# Patient Record
Sex: Male | Born: 1995 | Race: Black or African American | Hispanic: No | Marital: Single | Smoking: Never smoker
Health system: Southern US, Community
[De-identification: ages and names within clinical notes are randomized; demographics above are authoritative.]

## PROBLEM LIST (undated history)

## (undated) DIAGNOSIS — F988 Other specified behavioral and emotional disorders with onset usually occurring in childhood and adolescence: Secondary | ICD-10-CM

## (undated) DIAGNOSIS — R569 Unspecified convulsions: Secondary | ICD-10-CM

## (undated) DIAGNOSIS — G40309 Generalized idiopathic epilepsy and epileptic syndromes, not intractable, without status epilepticus: Secondary | ICD-10-CM

## (undated) HISTORY — DX: Unspecified convulsions: R56.9

## (undated) HISTORY — DX: Generalized idiopathic epilepsy and epileptic syndromes, not intractable, without status epilepticus: G40.309

## (undated) HISTORY — DX: Other specified behavioral and emotional disorders with onset usually occurring in childhood and adolescence: F98.8

---
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
## 2016-01-20 ENCOUNTER — Ambulatory Visit: Payer: Self-pay | Admitting: Pediatrics

## 2016-03-12 ENCOUNTER — Telehealth: Payer: Self-pay

## 2016-03-12 DIAGNOSIS — G40309 Generalized idiopathic epilepsy and epileptic syndromes, not intractable, without status epilepticus: Secondary | ICD-10-CM

## 2016-03-12 MED ORDER — LEVETIRACETAM 500 MG PO TABS: ORAL_TABLET | ORAL | 2 refills | Status: DC

## 2016-03-12 NOTE — Telephone Encounter (Signed)
Dana, mom, lvm stating that child is away at college. She is aware that he needs a f/u appt, however, he will not be home until Thanksgiving and winter break. She is requesting a refill be sent to the pharmacy for his levetiracetam 500 mg tab. CB# [phone]

## 2016-03-12 NOTE — Telephone Encounter (Signed)
Steven Norris was last seen June 21, 2014 and has cancelled 4 appointments since then. I sent in refills to last until mid-December, wrote a letter to the patient about the need for a visit and sent a message to the scheduler to schedule a follow up appointment for him when he is at home from college. TG

## 2016-03-12 NOTE — Telephone Encounter (Signed)
Noted, thank you

## 2016-03-13 ENCOUNTER — Telehealth: Payer: Self-pay

## 2016-03-13 NOTE — Telephone Encounter (Signed)
[facility] to [facility]

## 2016-03-13 NOTE — Telephone Encounter (Signed)
-----   Message from Elveria Risingina Goodpasture, NP sent at 03/12/2016 11:38 AM EDT ----- Regarding: Needs appointment Clayburn PertEvan needs an appointment with Dr Sharene SkeansHickling. It can be when he is at home from college for Thanksgiving or Christmas but he must be seen by July 01, 2016 in order for us to continue to refill his medication.  Let me know if you have questions.  Thanks, Inetta Fermoina

## 2016-06-18 ENCOUNTER — Ambulatory Visit (INDEPENDENT_AMBULATORY_CARE_PROVIDER_SITE_OTHER): Payer: Self-pay | Admitting: Pediatrics

## 2016-10-16 ENCOUNTER — Telehealth (INDEPENDENT_AMBULATORY_CARE_PROVIDER_SITE_OTHER): Payer: Self-pay | Admitting: Pediatrics

## 2016-10-16 DIAGNOSIS — G40309 Generalized idiopathic epilepsy and epileptic syndromes, not intractable, without status epilepticus: Secondary | ICD-10-CM

## 2016-10-16 MED ORDER — LEVETIRACETAM 500 MG PO TABS: ORAL_TABLET | ORAL | 0 refills | Status: DC

## 2016-10-16 NOTE — Telephone Encounter (Signed)
I called the patient and insisted that he keep his appointment on Nov 23, 2016.  We cannot continue to keep refilling prescriptions.  His last visit was in 2015.  He is away at college.

## 2016-10-16 NOTE — Telephone Encounter (Signed)
°  Who's calling (name and relationship to patient) : Phoebe Sharps Best contact number: [phone] Provider they see: Sharene Skeans, MD Reason for call: Needing refill    PRESCRIPTION REFILL ONLY  Name of prescription: 500 mg keppra Pharmacy: [facility], [place]

## 2016-11-23 ENCOUNTER — Encounter (INDEPENDENT_AMBULATORY_CARE_PROVIDER_SITE_OTHER): Payer: Self-pay | Admitting: Pediatrics

## 2016-11-23 ENCOUNTER — Ambulatory Visit (INDEPENDENT_AMBULATORY_CARE_PROVIDER_SITE_OTHER): Payer: Self-pay | Admitting: Pediatrics

## 2016-11-23 VITALS — BP 110/70 | HR 76 | Ht 73.75 in | Wt 199.8 lb

## 2016-11-23 DIAGNOSIS — G40309 Generalized idiopathic epilepsy and epileptic syndromes, not intractable, without status epilepticus: Secondary | ICD-10-CM

## 2016-11-23 MED ORDER — LEVETIRACETAM 500 MG PO TABS: ORAL_TABLET | ORAL | 5 refills | Status: DC

## 2016-11-23 NOTE — Progress Notes (Signed)
Patient: Steven Norris MRN: [id number] Sex: male DOB: 1995/08/12  Provider: Ellison Carwin, MD Location of Care: [facility]  Note type: Routine return visit  History of Present Illness: Referral Source: Dr. Duncan Dull History from: both parents, patient and [facility] chart Chief Complaint: Epilepsy/ADD  Steven Norris is a 21 y.o. male who returns on Nov 23, 2016 for the first time since June 21, 2014.  [name] has a history of generalized tonic-clonic seizures, the last of which was July 13, 2013.  When he was younger, he had non-convulsive seizures.  He had onset of seizures as a toddler and was on Neurontin.  When he developed generalized tonic-clonic seizures on December 14, 2001, he was switched from Neurontin to Depakote.  He was switched to levetiracetam when he experienced increasing frequency of nonconvulsive seizures.    Fortunately, levetiracetam has controlled all of his events.  He presents today for routine visit.  He has completed his first year at [employer] and hopes after next year to be able to transfer to [employer] to study business law.  His health is good.  He goes to bed between 11 and 12 and gets up at 9.  He works in Airline pilot.  Neither Devin nor his parents voiced any concerns today.  Review of Systems: 12 system review was assessed and was negative  Past Medical History Diagnosis Date  . Attention deficit disorder without mention of hyperactivity   . Generalized convulsive epilepsy without mention of intractable epilepsy   . Generalized nonconvulsive epilepsy without mention of intractable epilepsy   . Seizures (HCC)    Hospitalizations: No., Head Injury: No., Nervous System Infections: No., Immunizations up to date: Yes.    The patient had a generalized tonic-clonic seizure on December 14, 2001, and was switched from Neurontin to Depakote. Subsequently, he has experienced episodes of unresponsive staring that are quite  brief.  Switched from Depakote to Keppra which controlled his convulsive seizures without significant side effects.  He has attention deficit disorder, which is well controlled with generic Adderall.  ER visit due to depression in November of 2015.  Birth History 7 ounce infant born at [redacted] weeks gestational age to a 21 year old primigravida.  Gestation was unremarkable.  Mother received epidural anesthesia.  Normal spontaneous delivery.  Nursery course was uneventful. He went home with his mother.  Growth and development was recalled, but not recorded as normal.  Behavior History none  Surgical History Past Surgical History:  Procedure Laterality Date  . REPAIR PATELLAR / INFRAPATELLARTENDON Left 2010    Family History family history includes Heart attack (age of onset: 64) in his paternal grandfather. Family history is negative for migraines, seizures, intellectual disabilities, blindness, deafness, birth defects, chromosomal disorder, or autism.  Social History Social History   Social History  . Marital status: Single  . Years of education: 13 years   Social History Main Topics  . Smoking status: Never Smoker  . Smokeless tobacco: Never Used  . Alcohol use No  . Drug use: No  . 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     He attends [employer].    He lives with his mom. He has no siblings.    He works part-time.   No Known Allergies  Physical Exam BP 110/70   Pulse 76   Ht  6' 1.75" (1.873 m)   Wt 199 lb 12.8 oz (90.6 kg)   BMI 25.83 kg/m   General: alert, well developed, well nourished, in no acute distress, black hair, brown eyes, right handed Head: normocephalic, no dysmorphic features Ears, Nose and Throat: Otoscopic: tympanic membranes normal; pharynx: oropharynx is pink without exudates or tonsillar  hypertrophy Neck: supple, full range of motion, no cranial or cervical bruits Respiratory: auscultation clear Cardiovascular: no murmurs, pulses are normal Musculoskeletal: no skeletal deformities or apparent scoliosis Skin: no rashes or neurocutaneous lesions  Neurologic Exam  Mental Status: alert; oriented to person, place and year; knowledge is normal for age; language is normal Cranial Nerves: visual fields are full to double simultaneous stimuli; extraocular movements are full and conjugate; pupils are round reactive to light; funduscopic examination shows sharp disc margins with normal vessels; symmetric facial strength; midline tongue and uvula; air conduction is greater than bone conduction bilaterally Motor: Normal strength, tone and mass; good fine motor movements; no pronator drift Sensory: intact responses to cold, vibration, proprioception and stereognosis Coordination: good finger-to-nose, rapid repetitive alternating movements and finger apposition Gait and Station: normal gait and station: patient is able to walk on heels, toes and tandem without difficulty; balance is adequate; Romberg exam is negative; Gower response is negative Reflexes: symmetric and diminished bilaterally; no clonus; bilateral flexor plantar responses  Assessment 1.  Epilepsy generalized convulsive, G40.309.  Discussion [name] is doing well.  I explained that we would not consider taking him off medication until he is living on a campus where he does not need transportation.  I hope that we would successfully take him off medication.  If we are not, we want to make certain that it does not create a problem for him which would occur if he were to have breakthrough seizures now.  Plan I refilled his prescription for levetiracetam.  He will return to see me in a year.  I will see him sooner based on clinical need.  I spent 25 minutes of face-to-face time with [name] and his parents discussing strategies for  taking him off his medication and the benefits of timing it for when he hopefully goes to a four-year school.   Medication List   Accurate as of 11/23/16 12:05 PM.      levETIRAcetam 500 MG tablet Commonly known as:  KEPPRA Take 2 tablets by mouth twice a day    The medication list was reviewed and reconciled. All changes or newly prescribed medications were explained.  A complete medication list was provided to the patient/caregiver.  Deetta PerlaWilliam H Tylor Gambrill MD

## 2016-11-23 NOTE — Patient Instructions (Signed)
I refilled your prescription for 2 tablets twice daily.  I would like to see you in a year.  At that time if you [place] [facility], I will order an EEG and we will determine whether or not it's possible to taper and slowly take you off your medicine once you arrive on-campus.  Please sign up for My Chart to facilitate communication with my office.

## 2017-12-09 ENCOUNTER — Other Ambulatory Visit (INDEPENDENT_AMBULATORY_CARE_PROVIDER_SITE_OTHER): Payer: Self-pay | Admitting: Pediatrics

## 2017-12-09 DIAGNOSIS — G40309 Generalized idiopathic epilepsy and epileptic syndromes, not intractable, without status epilepticus: Secondary | ICD-10-CM

## 2018-01-23 ENCOUNTER — Other Ambulatory Visit (INDEPENDENT_AMBULATORY_CARE_PROVIDER_SITE_OTHER): Payer: Self-pay | Admitting: Pediatrics

## 2018-01-23 DIAGNOSIS — G40309 Generalized idiopathic epilepsy and epileptic syndromes, not intractable, without status epilepticus: Secondary | ICD-10-CM

## 2018-03-19 ENCOUNTER — Other Ambulatory Visit (INDEPENDENT_AMBULATORY_CARE_PROVIDER_SITE_OTHER): Payer: Self-pay | Admitting: Pediatrics

## 2018-03-19 DIAGNOSIS — G40309 Generalized idiopathic epilepsy and epileptic syndromes, not intractable, without status epilepticus: Secondary | ICD-10-CM

## 2018-04-29 ENCOUNTER — Other Ambulatory Visit: Payer: Self-pay | Admitting: Family

## 2018-04-29 DIAGNOSIS — G40309 Generalized idiopathic epilepsy and epileptic syndromes, not intractable, without status epilepticus: Secondary | ICD-10-CM

## 2018-04-30 ENCOUNTER — Telehealth (INDEPENDENT_AMBULATORY_CARE_PROVIDER_SITE_OTHER): Payer: Self-pay | Admitting: Pediatrics

## 2018-04-30 DIAGNOSIS — G40309 Generalized idiopathic epilepsy and epileptic syndromes, not intractable, without status epilepticus: Secondary | ICD-10-CM

## 2018-04-30 MED ORDER — LEVETIRACETAM 500 MG PO TABS: 1000.0000 mg | ORAL_TABLET | Freq: Two times a day (BID) | ORAL | 2 refills | Status: DC

## 2018-04-30 NOTE — Telephone Encounter (Signed)
°  Who's calling (name and relationship to patient) :Steven Norris  Best contact number:[phone]  Provider they see:  Reason for call: patient is away at college, needs refill for Steven Norris, mom states that the pharmacy want refill prescription, she states he is doing fine, just getting low on prescription- pharmacy is [facility] in [place] [address]-[phone] She also states he hasnt had an appt since 2018 and the earliest he could come in for an appt would be December    PRESCRIPTION REFILL ONLY  Name of prescription:  Pharmacy:

## 2018-04-30 NOTE — Telephone Encounter (Signed)
I spoke with mom and asked her to make an appointment for December.  I refilled prescription for 2 refills.

## 2018-08-08 ENCOUNTER — Other Ambulatory Visit (INDEPENDENT_AMBULATORY_CARE_PROVIDER_SITE_OTHER): Payer: Self-pay | Admitting: Pediatrics

## 2018-08-08 DIAGNOSIS — G40309 Generalized idiopathic epilepsy and epileptic syndromes, not intractable, without status epilepticus: Secondary | ICD-10-CM

## 2018-08-15 ENCOUNTER — Telehealth (INDEPENDENT_AMBULATORY_CARE_PROVIDER_SITE_OTHER): Payer: Self-pay | Admitting: Pediatrics

## 2018-08-15 DIAGNOSIS — G40309 Generalized idiopathic epilepsy and epileptic syndromes, not intractable, without status epilepticus: Secondary | ICD-10-CM

## 2018-08-15 MED ORDER — LEVETIRACETAM 500 MG PO TABS: 1000.0000 mg | ORAL_TABLET | Freq: Two times a day (BID) | ORAL | 1 refills | Status: DC

## 2018-08-15 NOTE — Telephone Encounter (Signed)
Rx has been sent to the pharmacy

## 2018-08-15 NOTE — Telephone Encounter (Signed)
°  Who's calling (name and relationship to patient) : Annabelle Harman (Mother)  Best contact number: [phone] Provider they see: Dr. Sharene Skeans  Reason for call: Mom requesting refill on pt's generic Keppra until his next appt on 3/25.      PRESCRIPTION REFILL ONLY  Name of prescription: Generic Keppra 500 mg [facility] [address] [phone]

## 2018-09-24 ENCOUNTER — Ambulatory Visit (INDEPENDENT_AMBULATORY_CARE_PROVIDER_SITE_OTHER): Payer: Self-pay | Admitting: Pediatrics

## 2018-10-22 ENCOUNTER — Other Ambulatory Visit (INDEPENDENT_AMBULATORY_CARE_PROVIDER_SITE_OTHER): Payer: Self-pay | Admitting: Pediatrics

## 2018-10-22 DIAGNOSIS — G40309 Generalized idiopathic epilepsy and epileptic syndromes, not intractable, without status epilepticus: Secondary | ICD-10-CM

## 2018-11-07 ENCOUNTER — Other Ambulatory Visit: Payer: Self-pay

## 2018-11-07 ENCOUNTER — Ambulatory Visit (INDEPENDENT_AMBULATORY_CARE_PROVIDER_SITE_OTHER): Payer: BLUE CROSS/BLUE SHIELD | Admitting: Pediatrics

## 2018-11-07 ENCOUNTER — Encounter (INDEPENDENT_AMBULATORY_CARE_PROVIDER_SITE_OTHER): Payer: Self-pay | Admitting: Pediatrics

## 2018-11-07 DIAGNOSIS — G40309 Generalized idiopathic epilepsy and epileptic syndromes, not intractable, without status epilepticus: Secondary | ICD-10-CM

## 2018-11-07 MED ORDER — LEVETIRACETAM 500 MG PO TABS: ORAL_TABLET | ORAL | 5 refills | Status: DC

## 2018-11-07 NOTE — Progress Notes (Signed)
This is a Pediatric Specialist E-Visit follow up consult provided via WebEx Steven Norris consented to an E-Visit consult today.  Location of patient: Steven Norris is at home  Location of provider: Jack QuartoWilliam ,MD is at [facility] Patient was referred by Steven Shamsullo, Teresa L, MD   The following participants were involved in this E-Visit: Steven PertEvan and Steven Norris  Chief Complain/ Reason for E-Visit today: Generalized convulsive epilepsy Total time on call: 15 minutes Follow up: 1 year    Patient: Steven Norris MRN: [id number] Sex: male DOB: 1995/11/27  Provider: Ellison CarwinWilliam , MD [facility] of Care[facility]  Note type: Routine return visit  History of Present Illness: Referral Source: Steven Dulleresa Tullo, MD History from: [facility] and [facility] Chief Complaint: Generalized convulsive epilepsy  Steven Norris is a 23 y.o. male who returns on Nov 07, 2018 for the first time since Nov 23, 2016.  The patient has primary generalized epilepsy with generalized convulsive seizures.  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  He has been treated with Neurontin and then Depakote and more recently with levetiracetam.  He has been reading about CBD and wondered whether that would be effective for him.  I told him that the medicine, Epidiolex was not available to him because his Norris type did not fit criteria for prescription.  He takes and tolerates generic levetiracetam and has not had any seizures in years.  He will begin senior year late this summer at [employer] majoring in business.  He is going to go to law school either at [employer] or [employer].  I told him that if he can get into [employer] and had a full ride scholarship that is what he should do.  He has worked in the [employer], but currently is unemployed because of the Aet[employer]onavirus.  He needs to have his car to get to and from his home to school and  to work.  For that reason, I do not think that there should be any change in his medication.  He is spending his time reading and watching TV.  He goes to bed at 1 a.m. and gets up at 9 or 10 a.m.  Though this is not the sleep schedule that I would prefer, he is getting adequate sleep, taking his medicine, and is having no difficulties.  He sleeps well.  He exercises by running in his backyard.  In general his health is good.  He had no other concerns today.  Review of Systems: A complete review of systems was assessed and was negative.  Past Medical History Diagnosis Date  . Attention deficit disorder without mention of hyperactivity   . Generalized convulsive epilepsy without mention of intractable epilepsy   . Generalized nonconvulsive epilepsy without mention of intractable epilepsy   . Seizures (HCC)    Hospitalizations: No., Head Injury: No., Nervous System Infections: No., Immunizations up to date: Yes.    Copied from prior chart Steven Norris on December 14, 2001, and was switched from Neurontin to Depakote. Subsequently, he has experienced episodes of unresponsive staring that are quite brief.  Switched from Depakote to Keppra which controlled his convulsive seizures without significant side effects.  He has attention deficit disorder, which is well controlled with generic Adderall.  ER visit due to depression in November of 2015.  Birth History 7 ounce infant born at 540 weeks gestational age to  a 22 year old primigravida.  Gestation was unremarkable.  Mother received epidural anesthesia.  Normal spontaneous delivery.  Nursery course was uneventful. He went home with his mother.  Growth and development was recalled, but not recorded as normal.  Behavior History none  Surgical History Procedure Laterality Date  . REPAIR PATELLAR / INFRAPATELLARTENDON Left 2010   Family History family history includes Heart attack (age of onset: 3)  in his paternal grandfather. Family history is negative for migraines, seizures, intellectual disabilities, blindness, deafness, birth defects, chromosomal disorder, or autism.  Social History Socioeconomic History  . Marital status: Single  . Years of education:  16 years  . Highest education level:  High school graduate, College junior  Occupational History  .  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 Financial resource strain: Not on file  . Food insecurity:    Worry: Not on file    Inability: Not on file  . Transportation needs:    Medical: Not on file    Non-medical: Not on file  Tobacco Use  . Smoking status: Never Smoker  . Smokeless tobacco: Never Used  Substance and Sexual Activity  . Alcohol use: No    Alcohol/week: 0.0 standard drinks  . Drug use: No  . 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     He attends [employer]    He lives with his mom. He has no siblings.    He works part-time, currently on Ship broker.   No Known Allergies  Physical Exam There were no vitals taken for this visit.  General: alert, well developed, well nourished, in no acute distress, black hair, brown eyes, right handed Head: normocephalic, no dysmorphic features Neck: supple, full range of motion Musculoskeletal: no skeletal deformities or apparent scoliosis Skin: no neurocutaneous lesions, mild facial acne  Neurologic Exam  Mental Status: alert; oriented to person, place and year; knowledge is normal for age; language is normal Cranial Nerves: visual fields are full to double simultaneous stimuli; extraocular movements are full and conjugate; symmetric facial strength; midline tongue; hearing appears to be normal bilaterally Motor: normal functional strength, tone and mass; good fine motor movements; no pronator drift Coordination: good  finger-to-nose, rapid repetitive alternating movements and finger apposition Gait and Station: normal gait and station: patient is able to walk on heels, toes and tandem without difficulty; balance is adequate; Romberg exam is negative; Gower response is negative Reflexes: symmetric and diminished bilaterally; no clonus; bilateral flexor plantar responses  Assessment 1.  Generalized convulsive epilepsy, G40.309.  Discussion I am pleased that the patient is doing well.  We discussed the need to continue his levetiracetam without change and the reason why CBD oil or Epidiolex would not be appropriate for him.  Plan I refilled his prescription for levetiracetam.  He will return to see me in a year.  Greater than 50% of a 15 minute visit was spent in counseling and coordination of care.  His evaluation was carried out through WebEx.   Medication List   Accurate as of Nov 07, 2018  4:12 PM. If you have any questions, ask your nurse or doctor.    levETIRAcetam 500 MG tablet Commonly known as:  KEPPRA TAKE 2 TABLETS(1000 MG) BY MOUTH TWICE DAILY    The medication list was reviewed and reconciled. All changes or newly prescribed medications were explained.  A complete medication list was provided to the patient/caregiver.  Jodi Geralds MD

## 2018-11-07 NOTE — Patient Instructions (Signed)
It was a pleasure to see you today, thank you for inviting me into your home.  I am pleased that there have been no seizures, but school is going well and that she will start her senior year late this summer.  Sorry that you are currently not working and hope that that will change soon.  We talked about continuing levetiracetam.  We also talked about CBD.  Since you have done so well with your seizures we do not want to make any changes because you had a breakthrough seizure it would become a huge problem getting to and from work and to and from school and your home.  This summer you need to get exercise several times a week, do not let your bedtime hour get any later than it is, take your medication as prescribed.  6 months from now I will refill your prescription.  We will see you again next May around the time you graduate.  When you are out in public make certain that you are wearing a mask or face covering.  Let me know if there is anything that I can do for you between now and when I see you again

## 2019-06-25 ENCOUNTER — Telehealth (INDEPENDENT_AMBULATORY_CARE_PROVIDER_SITE_OTHER): Payer: Self-pay | Admitting: Pediatrics

## 2019-06-25 DIAGNOSIS — G40309 Generalized idiopathic epilepsy and epileptic syndromes, not intractable, without status epilepticus: Secondary | ICD-10-CM

## 2019-06-25 MED ORDER — LEVETIRACETAM 500 MG PO TABS: ORAL_TABLET | ORAL | 5 refills | Status: DC

## 2019-06-25 NOTE — Telephone Encounter (Signed)
Mom has a question about pt's Keppra.

## 2019-06-25 NOTE — Telephone Encounter (Signed)
Refill has been sent to the pharmacy.  

## 2019-06-25 NOTE — Telephone Encounter (Signed)
Thank you :)

## 2019-06-25 NOTE — Telephone Encounter (Signed)
Spoke with mom about her phone message. She was asking about the dosage instructions for his Keppra. He is supposed to be taking  2 tablets by mouth twice a day but the pharmacy is filling it as one tablet in the morning and one tablet in the evening. I will send it in with more clear instructions

## 2019-08-14 ENCOUNTER — Other Ambulatory Visit: Payer: Self-pay

## 2019-08-14 ENCOUNTER — Encounter (HOSPITAL_COMMUNITY): Payer: Self-pay

## 2019-08-14 ENCOUNTER — Ambulatory Visit (HOSPITAL_COMMUNITY)
Admission: EM | Admit: 2019-08-14 | Discharge: 2019-08-14 | Disposition: A | Discharge status: Home / Self Care | Payer: BC Managed Care – PPO

## 2019-08-14 DIAGNOSIS — R112 Nausea with vomiting, unspecified: Secondary | ICD-10-CM | POA: Diagnosis not present

## 2019-08-14 NOTE — ED Triage Notes (Signed)
Pt reports waking up feeling nauseous and he threw up twice this morning. Pt reports feeling better but that he needs a note for work. Denies any pain or any symptoms at this time.

## 2019-08-14 NOTE — ED Provider Notes (Signed)
[facility]   MRN: [id number] DOB: [birth year]  Subjective:   Steven Norris is a 24 y.o. male presenting for acute onset of 2 episodes of vomiting this morning. Patient missed work and requires a note for work. States that he feels completely fine now and does not have nausea or vomiting. He was able to carry on with ADL. Denies direct COVID 19 contacts/exposure.   No current facility-administered medications for this encounter.  Current Outpatient Medications:  .  levETIRAcetam (KEPPRA) 500 MG tablet, TAKE 2 TABLETS IN THE MORNING AND 2 TABLETS IN THE EVENING. THIS WILL EQUAL 2,000 MG DAILY, Disp: 124 tablet, Rfl: 5   No Known Allergies  Past Medical History:  Diagnosis Date  . Attention deficit disorder without mention of hyperactivity   . Generalized convulsive epilepsy without mention of intractable epilepsy   . Generalized nonconvulsive epilepsy without mention of intractable epilepsy   . Seizures (HCC)      Past Surgical History:  Procedure Laterality Date  . REPAIR PATELLAR / INFRAPATELLARTENDON Left 2010    Family History  Problem Relation Age of Onset  . Heart attack Paternal Grandfather 61       Deceased  . Healthy Mother   . Healthy Father     Social History   Tobacco Use  . Smoking status: Never Smoker  . Smokeless tobacco: Never Used  Substance Use Topics  . Alcohol use: No    Alcohol/week: 0.0 standard drinks  . Drug use: No    Review of Systems  Constitutional: Negative for chills, fever and malaise/fatigue.  HENT: Negative for congestion, ear pain, sinus pain and sore throat.   Eyes: Negative for blurred vision, double vision, discharge and redness.  Respiratory: Negative for cough, hemoptysis, shortness of breath and wheezing.   Cardiovascular: Negative for chest pain.  Gastrointestinal: Positive for nausea and vomiting. Negative for abdominal pain, blood in stool, constipation and diarrhea.  Genitourinary: Negative for dysuria, flank  pain, frequency, hematuria and urgency.  Musculoskeletal: Negative for myalgias.  Skin: Negative for rash.  Neurological: Negative for dizziness, weakness and headaches.  Psychiatric/Behavioral: Negative for depression and substance abuse.     Objective:   Vitals: BP 134/83   Pulse (!) 55   Temp 98 F (36.7 C)   Resp 18   SpO2 98%   Physical Exam Constitutional:      General: He is not in acute distress.    Appearance: Normal appearance. He is well-developed and normal weight. He is not ill-appearing, toxic-appearing or diaphoretic.  HENT:     Head: Normocephalic and atraumatic.     Right Ear: External ear normal.     Left Ear: External ear normal.     Nose: Nose normal.     Mouth/Throat:     Pharynx: Oropharynx is clear.  Eyes:     General: No scleral icterus.       Right eye: No discharge.        Left eye: No discharge.     Extraocular Movements: Extraocular movements intact.     Pupils: Pupils are equal, round, and reactive to light.  Cardiovascular:     Rate and Rhythm: Normal rate.  Pulmonary:     Effort: Pulmonary effort is normal.  Musculoskeletal:     Cervical back: Normal range of motion.  Neurological:     Mental Status: He is alert and oriented to person, place, and time.  Psychiatric:        Mood and Affect: Mood  normal.        Behavior: Behavior normal.        Thought Content: Thought content normal.        Judgment: Judgment normal.      Assessment and Plan :   1. Nausea and vomiting, intractability of vomiting not specified, unspecified vomiting type     Patient likely had gastroenteritis. This is resolved. He refused COVID 19 testing, medications for supportive care for n/v. Note for work provided. Follow up prn.    Jaynee Eagles, PA-C 08/15/19 1026

## 2020-05-10 ENCOUNTER — Telehealth (INDEPENDENT_AMBULATORY_CARE_PROVIDER_SITE_OTHER): Payer: Self-pay | Admitting: Pediatrics

## 2020-05-10 DIAGNOSIS — G40309 Generalized idiopathic epilepsy and epileptic syndromes, not intractable, without status epilepticus: Secondary | ICD-10-CM

## 2020-05-10 MED ORDER — LEVETIRACETAM 500 MG PO TABS: ORAL_TABLET | ORAL | 0 refills | Status: DC

## 2020-05-10 NOTE — Telephone Encounter (Signed)
Sent in a 30-day refill for the patient. Patient will call back to schedule his appointment.

## 2020-05-10 NOTE — Telephone Encounter (Signed)
Who's calling (name and relationship to patient) : Theophilus Bones mom   Best contact number: [phone]  Provider they see: Dr. Sharene Skeans  Reason for call: Requesting refill for keppra patient only has six pills.  Mom states patient must call back to schedule appt. She doesn't know his schedule  Call ID:      PRESCRIPTION REFILL ONLY  Name of prescription: keppra  [facility] [address]

## 2020-06-09 ENCOUNTER — Telehealth (INDEPENDENT_AMBULATORY_CARE_PROVIDER_SITE_OTHER): Payer: Self-pay | Admitting: Pediatrics

## 2020-06-09 DIAGNOSIS — G40309 Generalized idiopathic epilepsy and epileptic syndromes, not intractable, without status epilepticus: Secondary | ICD-10-CM

## 2020-06-09 MED ORDER — LEVETIRACETAM 500 MG PO TABS: ORAL_TABLET | ORAL | 0 refills | Status: DC

## 2020-06-09 NOTE — Telephone Encounter (Signed)
Who's calling (name and relationship to patient) : Steven Norris   Best contact number: [phone]  Provider they see: Dr. Sharene Skeans  Reason for call:   Call ID:      PRESCRIPTION REFILL ONLY  Name of prescription: Levetiracetam  [facility] [address]

## 2020-06-09 NOTE — Telephone Encounter (Signed)
RX has been sent to the pharmacy.

## 2020-06-22 ENCOUNTER — Other Ambulatory Visit: Payer: Self-pay

## 2020-06-22 ENCOUNTER — Encounter (INDEPENDENT_AMBULATORY_CARE_PROVIDER_SITE_OTHER): Payer: Self-pay | Admitting: Pediatrics

## 2020-06-22 ENCOUNTER — Ambulatory Visit (INDEPENDENT_AMBULATORY_CARE_PROVIDER_SITE_OTHER): Payer: BLUE CROSS/BLUE SHIELD | Admitting: Pediatrics

## 2020-06-22 DIAGNOSIS — R404 Transient alteration of awareness: Secondary | ICD-10-CM

## 2020-06-22 DIAGNOSIS — G40309 Generalized idiopathic epilepsy and epileptic syndromes, not intractable, without status epilepticus: Secondary | ICD-10-CM | POA: Diagnosis not present

## 2020-06-22 MED ORDER — LEVETIRACETAM 500 MG PO TABS: ORAL_TABLET | ORAL | 5 refills | Status: DC

## 2020-06-22 NOTE — Patient Instructions (Signed)
It was a pleasure to see you today.  I am glad that your seizures are for the most part in good control.  I worry a little bit about the absence seizure's.  If they begin to be more frequent I need to know about it because it may either need to increase her medication or add a different 1.  I am very pleased that you are doing so well in school and that she will graduate this spring.  It sounds like you have a good plan for what happens next.  I would like to see you again in 6 months.  I will retire March 31, 2021 and want to make certain that you have a provider before then who can prescribe your medications and see you.  This can be Dr. Sherryll Burger at [facility] in [place] but it also could be either the groups here in [facility] or [facility].  Could be in the epilepsy groups at [facility] or [facility] or [facility] in [place].  In part this will depend upon where you are and what you are doing.  My goal is to make certain that your next neurologist provides care for you as I have in the past.  Please let me know if there is anything that changes between now and 6 months from now.  Hopefully we will have this sorted out between now and then.

## 2020-06-22 NOTE — Progress Notes (Signed)
Patient: Steven Norris MRN: [id number] Sex: male DOB: 1996-06-11  Provider: Ellison Carwin, MD Location of Care: [facility]  Note type: Routine return visit  History of Present Illness: Referral Source: Duncan Dull, MD History from: patient and [facility] chart Chief Complaint: Epilepsy  Steven Norris is a 24 y.o. male who was evaluated June 22, 2020 for the first time since 02/07/2019.  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  When younger he had absence seizures.  He refers to these as "small ones".  He said that he is not having those nor is he having the generalized tonic-clonic but when I asked him he last had a small 1 he said it was a week ago.  He says that when he becomes stressed, but this may bring on the events.  He has brief episodes where he gets confused and then he returns to baseline.  I am not certain that these represent seizures.  This is never happened when he is behind the wheel of a car.  Medications which had been used to treat his seizures have included Neurontin, Depakote more recently levetiracetam.  The latter has worked best.  He has not experienced any side effects from levetiracetam.  His general health is good.  He has not contracted Covid.  He is not vaccinated.  He spent some time talking about this and I strongly urged him to vaccinate himself.  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  He has had a number of internships.  He is going to take a year off and applied to [place] as [place] to their program and business in Social worker.  He will need to take the L-SAT G-MATt.  I strongly urged him to take prep courses to maximize his school.  Review of Systems: A complete review of systems was remarkable for patient is here to be seen for epilepsy. He reports that he has been doing very well. He states that he has not had any seizures since  his last visit. He states that he has not even had any small seizures. He has no concerns at this time., all other systems reviewed and negative.  Past Medical History Diagnosis Date  . Attention deficit disorder without mention of hyperactivity   . Generalized convulsive epilepsy without mention of intractable epilepsy   . Generalized nonconvulsive epilepsy without mention of intractable epilepsy   . Seizures (HCC)    Hospitalizations: No., Head Injury: No., Nervous System Infections: No., Immunizations up to date: Yes.    Copied from prior chart notes Steven Norris had a generalized tonic-clonic seizure on December 14, 2001, and was switched from Neurontin to Depakote. Subsequently, he has experienced episodes of unresponsive staring that are quite brief.  Switched from Depakote to Keppra which controlled his convulsive and nonconvulsive seizures without significant side effects.  He has attention deficit disorder, which is well controlled with generic Adderall.  ER visit due to depression in November of 2015.  Birth History 7 ounce infant born at [redacted] weeks gestational age to a 23 year old primigravida.  Gestation was unremarkable.  Mother received epidural anesthesia.  Normal spontaneous delivery.  Nursery course was uneventful. He went home with his mother.  Growth and development was recalled, but not recorded as normal.  Behavior History none  Surgical History Procedure Laterality Date  . REPAIR PATELLAR / INFRAPATELLARTENDON Left 2010   Family History family history includes Healthy in his  father and mother; Heart attack (age of onset: 12) in his paternal grandfather. Family history is negative for migraines, seizures, intellectual disabilities, blindness, deafness, birth defects, chromosomal disorder, or autism.  Social History Socioeconomic History  . Marital status: Single  . Years of education:  71  . Highest education level:  Dietitian  Occupational History   .  Part-time Conservation officer, nature at [employer]  . Smoking status: Never Smoker  . Smokeless tobacco: Never Used  Substance and Sexual Activity  . Alcohol use: No    Alcohol/week: 0.0 standard drinks  . Drug use: No  . 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     He attends [employer].    He lives with his mom. He has no siblings.    He works part-time.   No Known Allergies  Physical Exam BP 130/70   Pulse 72   Ht 6' 3.25" (1.911 m)   Wt 226 lb 6.4 oz (102.7 kg)   BMI 28.11 kg/m   General: alert, well developed, well nourished, in no acute distress, black hair, brown eyes, right handed Head: normocephalic, no dysmorphic features Ears, Nose and Throat: Otoscopic: tympanic membranes normal; pharynx: oropharynx is pink without exudates or tonsillar hypertrophy Neck: supple, full range of motion, no cranial or cervical bruits Respiratory: auscultation clear Cardiovascular: no murmurs, pulses are normal Musculoskeletal: no skeletal deformities or apparent scoliosis Skin: no rashes or neurocutaneous lesions  Neurologic Exam  Mental Status: alert; oriented to person, place and year; knowledge is normal for age; language is normal Cranial Nerves: visual fields are full to double simultaneous stimuli; extraocular movements are full and conjugate; pupils are round reactive to light; funduscopic examination shows sharp disc margins with normal vessels; symmetric facial strength; midline tongue and uvula; air conduction is greater than bone conduction bilaterally Motor: Normal strength, tone and mass; good fine motor movements; no pronator drift Sensory: intact responses to cold, vibration, proprioception and stereognosis Coordination: good finger-to-nose, rapid repetitive alternating movements and finger apposition Gait and Station: normal gait and station: patient is able to walk on heels,  toes and tandem without difficulty; balance is adequate; Romberg exam is negative; Gower response is negative Reflexes: symmetric and diminished bilaterally; no clonus; bilateral flexor plantar responses  Assessment 1.  Generalized convulsive epilepsy, G40.309. 2.  Transient alteration of awareness, R40.4.  Discussion I am pleased that convulsive seizures are under complete control.  I am a little unsettled about his "small spells".  He said that he did not have any and then he told me that he had one last week the episodes seem to come when he is under stress they have not interfered with anything that he does they are transient and infrequent they have not occurred when he is driving.  It do not think there is any way that we can capture what is happening to him.  For that reason I noted that he has alteration of awareness so that we can keep track of it.  Plan Kristan understands that I will retire from the practice of medicine March 31, 2021.  I asked him to come back in 6 months' time so that we can make certain that we have a plan for transition to an adult neurologist.  This could take place in [place] [facility] [place] [facility] or in [place].  Ultimately [place] care to there.  I asked him to contact me if he has more of his "small spells".  Might have to repeat his EEG and consider either increasing or changing his medication.  Ethosuximide might be helpful in this circumstance but it bothers me that the episodes only occur when he becomes upset.  This is why I did not make any change.  I refilled his prescription for levetiracetam.  Greater than 50% of a 30-minute visit was spent in counseling and coordination of care concerning his seizures and long-term transition of care.   Medication List   Accurate as of June 22, 2020 11:59 PM. If you have any questions, ask your nurse or doctor.    levETIRAcetam 500 MG tablet Commonly  known as: KEPPRA TAKE 2 TABLETS IN THE MORNING AND 2 TABLETS IN THE EVENING What changed: additional instructions Changed by: Ellison Carwin, MD    The medication list was reviewed and reconciled. All changes or newly prescribed medications were explained.  A complete medication list was provided to the patient/caregiver.  Deetta Perla MD

## 2020-06-23 DIAGNOSIS — R404 Transient alteration of awareness: Secondary | ICD-10-CM | POA: Insufficient documentation

## 2020-11-06 ENCOUNTER — Encounter (INDEPENDENT_AMBULATORY_CARE_PROVIDER_SITE_OTHER): Payer: Self-pay

## 2020-12-28 ENCOUNTER — Ambulatory Visit (INDEPENDENT_AMBULATORY_CARE_PROVIDER_SITE_OTHER): Payer: BC Managed Care – PPO | Admitting: Pediatrics

## 2021-01-09 ENCOUNTER — Telehealth (INDEPENDENT_AMBULATORY_CARE_PROVIDER_SITE_OTHER): Payer: Self-pay | Admitting: Pediatrics

## 2021-01-09 DIAGNOSIS — G40309 Generalized idiopathic epilepsy and epileptic syndromes, not intractable, without status epilepticus: Secondary | ICD-10-CM

## 2021-01-09 NOTE — Telephone Encounter (Signed)
Transfer of care to [facility].  I will see him January 19, 2021.

## 2021-01-19 ENCOUNTER — Other Ambulatory Visit: Payer: Self-pay

## 2021-01-19 ENCOUNTER — Ambulatory Visit (INDEPENDENT_AMBULATORY_CARE_PROVIDER_SITE_OTHER): Payer: BC Managed Care – PPO | Admitting: Pediatrics

## 2021-01-19 ENCOUNTER — Encounter (INDEPENDENT_AMBULATORY_CARE_PROVIDER_SITE_OTHER): Payer: Self-pay | Admitting: Pediatrics

## 2021-01-19 VITALS — BP 120/90 | HR 72 | Ht 75.5 in | Wt 227.0 lb

## 2021-01-19 DIAGNOSIS — G40309 Generalized idiopathic epilepsy and epileptic syndromes, not intractable, without status epilepticus: Secondary | ICD-10-CM | POA: Diagnosis not present

## 2021-01-19 MED ORDER — LEVETIRACETAM 500 MG PO TABS: ORAL_TABLET | ORAL | 5 refills | Status: DC

## 2021-01-19 NOTE — Patient Instructions (Addendum)
At [facility], we are committed to providing exceptional care. You will receive a patient satisfaction survey through text or email regarding your visit today. Your opinion is important to me. Comments are appreciated.   It's been a real pleasure to provide care to you since you were a young man.  I am very proud of what you have accomplished and what your plans.  Please let me know if there is any problem with your prescription or any problem with your referral to [facility].  I will be available until March 31, 2021.

## 2021-01-19 NOTE — Progress Notes (Signed)
Patient: Steven Norris MRN: [id number] Sex: male DOB: 03-31-1996  Provider: Ellison Carwin, MD Location of Care: [facility]  Note type: Routine return visit  History of Present Illness: Referral Source: Steven Dull, MD History from: patient and [facility] chart Chief Complaint: Epilepsy  Steven Norris is a 25 y.o. male who was evaluated January 19, 2021 for the first time since June 22, 2020.  Evidence primary generalized epilepsy with generalized convulsive seizures.  When he was younger he had absence seizure's.  He says that he has had no more absence seizures.  He takes and tolerates levetiracetam without side effects.  I have not been willing to take him off his medication.  At some point he wants to drive an automobile and he will be able to get his license if we take him off his medication.  He graduated from [employer] in business administration, minoring in Investment banker, corporate.  He has been accepted to the [place] school MBA program at [employer] and wants to study information technology as it applies to business.  He will begin in January 2023.  He works part-time in a [employer] 20 to 25 hours a week.  He like to find some other gainful employment but this will do for now.  Though he is obese his health is otherwise good.  He is not contracted COVID and has not been vaccinated.  He lives in [place] and commutes to [place] closer school and his health care.  Review of Systems: A complete review of systems was remarkable for patient is here to be seen for a follow up.Patient reports that he has not had any seizures since his last visit. He states that he has no concerns at this time , all other systems reviewed and negative.  Past Medical History Diagnosis Date   Attention deficit disorder without mention of hyperactivity    Generalized convulsive epilepsy without mention of intractable epilepsy    Generalized nonconvulsive epilepsy without mention  of intractable epilepsy    Seizures (HCC)    Hospitalizations: No., Head Injury: No., Nervous System Infections: No., Immunizations up to date: Yes.    Copied from prior chart notes Steven Norris had a generalized tonic-clonic seizure on December 14, 2001, and was switched from Neurontin to Depakote.  Subsequently, he has experienced episodes of unresponsive staring that are quite brief.   Switched from Depakote to Keppra which controlled his convulsive and nonconvulsive seizures without significant side effects.   He has attention deficit disorder, which is well controlled with generic Adderall.    ER visit due to depression in November of 2015.   Birth History 7 ounce infant born at [redacted] weeks gestational age to a 24 year old primigravida.   Gestation was unremarkable.   Mother received epidural anesthesia.   Normal spontaneous delivery.   Nursery course was uneventful. He went home with his mother.   Growth and development was recalled, but not recorded as normal.  Behavior History none  Surgical History Procedure Laterality Date   REPAIR PATELLAR / INFRAPATELLARTENDON Left 2010   Family History family history includes Healthy in his father and mother; Heart attack (age of onset: 46) in his paternal grandfather. Family history is negative for migraines, seizures, intellectual disabilities, blindness, deafness, birth defects, chromosomal disorder, or autism.  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    He attended [employer].   He  lives with his mom. He has no siblings.   He works part-time.   No Known Allergies  Physical Exam BP 120/90   Pulse 72   Ht 6' 3.5" (1.918 m)   Wt 227 lb (103 kg)   BMI 28.00 kg/m   General: alert, well developed, well nourished, in no acute distress, black hair, brown eyes, right handed Head: normocephalic, no dysmorphic features Ears, Nose and Throat: Otoscopic: tympanic membranes normal; pharynx: oropharynx is pink without exudates or tonsillar hypertrophy Neck: supple, full range of motion, no cranial or cervical bruits Respiratory: auscultation clear Cardiovascular: no murmurs, pulses are normal Musculoskeletal: no skeletal deformities or apparent scoliosis Skin: no rashes or neurocutaneous lesions  Neurologic Exam  Mental Status: alert; oriented to person, place and year; knowledge is normal for age; language is normal Cranial Nerves: visual fields are full to double simultaneous stimuli; extraocular movements are full and conjugate; pupils are round reactive to light; funduscopic examination shows sharp disc margins with normal vessels; symmetric facial strength; midline tongue and uvula; air conduction is greater than bone conduction bilaterally Motor: Normal strength, tone and mass; good fine motor movements; no pronator drift Sensory: intact responses to cold, vibration, proprioception and stereognosis Coordination: good finger-to-nose, rapid repetitive alternating movements and finger apposition Gait and Station: normal gait and station: patient is able to walk on heels, toes and tandem without difficulty; balance is adequate; Romberg exam is negative; Gower response is negative Reflexes: symmetric and diminished bilaterally; no clonus; bilateral flexor plantar responses   Assessment 1.  Generalized convulsive epilepsy, G40.309.  Discussion I am pleased that Steven Norris has been seizure-free.  I expressed my admiration that he graduated from college and that he has plans to  extend his education.  Plan He will transition to care to [facility].  I asked him to call me if there is any problem with the referral..  50% of a 30-minute visit was spent in counseling and coordination of care concerning his epilepsy, refilling his prescription for levetiracetam and discussing transition issues..  I will be available to him until March 31, 2021 when I retire.   Medication List    Accurate as of January 19, 2021 11:19 AM. If you have any questions, ask your nurse or doctor.     levETIRAcetam 500 MG tablet Commonly known as: KEPPRA TAKE 2 TABLETS IN THE MORNING AND 2 TABLETS IN THE EVENING     The medication list was reviewed and reconciled. All changes or newly prescribed medications were explained.  A complete medication list was provided to the patient/caregiver.  Deetta Perla MD

## 2021-12-27 ENCOUNTER — Ambulatory Visit: Payer: BC Managed Care – PPO | Admitting: Neurology

## 2021-12-27 ENCOUNTER — Encounter: Payer: Self-pay | Admitting: Neurology

## 2021-12-27 VITALS — BP 116/71 | HR 67 | Ht 74.0 in | Wt 218.0 lb

## 2021-12-27 DIAGNOSIS — Z5181 Encounter for therapeutic drug level monitoring: Secondary | ICD-10-CM | POA: Diagnosis not present

## 2021-12-27 DIAGNOSIS — G40309 Generalized idiopathic epilepsy and epileptic syndromes, not intractable, without status epilepticus: Secondary | ICD-10-CM | POA: Diagnosis not present

## 2021-12-27 MED ORDER — LEVETIRACETAM 500 MG PO TABS: ORAL_TABLET | ORAL | 6 refills | Status: DC

## 2021-12-27 NOTE — Patient Instructions (Signed)
Continue with Keppra 1000 mg twice daily We will check Keppra level today with vitamin D Routine EEG Follow-up in 6 months or sooner if worse.

## 2021-12-27 NOTE — Progress Notes (Signed)
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    HISTORICAL  CHIEF COMPLAINT:  Chief Complaint  Patient presents with   New Patient (Initial Visit)    Room 13, alone Reports seizure like activty 2 months ago, doing well on keppra 1000 mg BID    HISTORY OF PRESENT ILLNESS:  This is a 26 year old gentleman past medical history of generalized epilepsy, previously managed by Dr. Sharene Skeans who is presenting to establish care since retirement of Dr. Sharene Skeans.  Per patient and chart review the first seizure happened on December 14, 2001 at the age of 6, described as generalized epilepsy.  He was put on initially on Neurontin and then later switched to Depakote.  He subsequently developed absence seizure therefore he was switched from Depakote to Keppra which seemed to control both of his seizures.  He reported his last generalized convulsion was 5 years ago and his last staring spell was 2 months ago.  He does have staring spell when he misses his Keppra dose.  Again these are very brief lasting only seconds.  No other complaint or concern. He denies any seizure risk factor, denies any side effect from the medication and denies any injury from the seizures.   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   Handedness: Right   Onset: at the age of 8   Seizure Type: Generalized shaking (last one was 5 year ago), staring spell (last one 2 months)   Current frequency: Generalized shaking (last one was 5 year ago), staring spell (last one 2 months)  Any injuries from seizures: None   Seizure risk factors: Denies   Previous ASMs: Gabapentin, valproic acid, levetiracetam  Currenty ASMs: Keppra 1000 mg BID   ASMs side effects: None   Brain Images: None available for review  Previous EEGs: None  available for review   OTHER MEDICAL CONDITIONS: Generalized epilepsy.   REVIEW OF SYSTEMS: Full 14 system review of systems performed and negative with exception of: as noted in the HPI   ALLERGIES: No Known Allergies  HOME MEDICATIONS: Outpatient Medications Prior to Visit  Medication Sig Dispense Refill   levETIRAcetam (KEPPRA) 500 MG tablet TAKE 2 TABLETS IN THE MORNING AND 2 TABLETS IN THE EVENING 124 tablet 5   No facility-administered medications prior to visit.    PAST MEDICAL HISTORY: Past Medical History:  Diagnosis Date   Attention deficit disorder without mention of hyperactivity    Generalized convulsive epilepsy without mention of intractable epilepsy    Generalized nonconvulsive epilepsy without mention of intractable epilepsy    Seizures (HCC)     PAST SURGICAL HISTORY: Past Surgical History:  Procedure Laterality Date   REPAIR PATELLAR / INFRAPATELLARTENDON Left 2010    FAMILY HISTORY: Family History  Problem Relation Age of Onset   Heart attack Paternal Grandfather 16       Deceased   Healthy Mother    Healthy Father     SOCIAL HISTORY: Social History   Socioeconomic History   Marital status: Single    Spouse name: Not on file   Number of children: Not on file   Years of education: Not on file   Highest education level: Not on file  Occupational History   Not on file  Tobacco Use   Smoking status: Never   Smokeless  tobacco: Never  Substance and Sexual Activity   Alcohol use: No    Alcohol/week: 0.0 standard drinks of alcohol   Drug use: No   Sexual activity: Yes    Partners: Female    Birth control/protection: Condom  Other Topics Concern   Not on file  Social History Narrative   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    He attended [employer].   He lives with his mom. He has no siblings.   He works part-time.   Social Determinants of Health   Financial Resource Strain: Not on file  Food Insecurity: Not on file  Transportation Needs: Not on  file  Physical Activity: Not on file  Stress: Not on file  Social Connections: Not on file  Intimate Partner Violence: Not on file    PHYSICAL EXAM  GENERAL EXAM/CONSTITUTIONAL: Vitals:  Vitals:   12/27/21 0840  BP: 116/71  Pulse: 67  Weight: 218 lb (98.9 kg)  Height: 6\' 2"  (1.88 m)   Body mass index is 27.99 kg/m. Wt Readings from Last 3 Encounters:  12/27/21 218 lb (98.9 kg)  01/19/21 227 lb (103 kg)  06/22/20 226 lb 6.4 oz (102.7 kg)   Patient is in no distress; well developed, nourished and groomed; neck is supple  EYES: Pupils round and reactive to light, Visual fields full to confrontation, Extraocular movements intacts,  No results found.  MUSCULOSKELETAL: Gait, strength, tone, movements noted in Neurologic exam below  NEUROLOGIC: MENTAL STATUS:      No data to display         awake, alert, oriented to person, place and time recent and remote memory intact normal attention and concentration language fluent, comprehension intact, naming intact fund of knowledge appropriate  CRANIAL NERVE:  2nd, 3rd, 4th, 6th - pupils equal and reactive to light, visual fields full to confrontation, extraocular muscles intact, no nystagmus 5th - facial sensation symmetric 7th - facial strength symmetric 8th - hearing intact 9th - palate elevates symmetrically, uvula midline 11th - shoulder shrug symmetric 12th - tongue protrusion midline  MOTOR:  normal bulk and tone, full strength in the BUE, BLE  SENSORY:  normal and symmetric to light touch, pinprick, temperature, vibration  COORDINATION:  finger-nose-finger, fine finger movements normal  REFLEXES:  deep tendon reflexes present and symmetric  GAIT/STATION:  normal   DIAGNOSTIC DATA (LABS, IMAGING, TESTING) - I reviewed patient records, labs, notes, testing and imaging myself where available.  Lab Results  Component Value Date   WBC 4.6 05/18/2014   HGB 16.0 05/18/2014   HCT 47.8 05/18/2014   MCV  88.7 05/18/2014   PLT 272 05/18/2014      Component Value Date/Time   NA 140 05/18/2014 1949   NA 141 10/01/2013 2143   K 4.0 05/18/2014 1949   K 3.9 10/01/2013 2143   CL 101 05/18/2014 1949   CL 107 10/01/2013 2143   CO2 26 05/18/2014 1949   CO2 28 (H) 10/01/2013 2143   GLUCOSE 82 05/18/2014 1949   GLUCOSE 108 (H) 10/01/2013 2143   BUN 10 05/18/2014 1949   BUN 10 10/01/2013 2143   CREATININE 0.88 05/18/2014 1949   CREATININE 0.94 10/01/2013 2143   CALCIUM 10.0 05/18/2014 1949   CALCIUM 9.0 10/01/2013 2143   PROT 7.8 05/18/2014 1949   PROT 7.7 10/01/2013 2143   ALBUMIN 4.6 05/18/2014 1949   ALBUMIN 4.2 10/01/2013 2143   AST 17 05/18/2014 1949   AST 22 10/01/2013 2143   ALT 18 05/18/2014 1949  ALT 27 10/01/2013 2143   ALKPHOS 120 05/18/2014 1949   ALKPHOS 192 (H) 10/01/2013 2143   BILITOT 0.8 05/18/2014 1949   BILITOT 0.6 10/01/2013 2143   GFRNONAA NOT CALCULATED 05/18/2014 1949   GFRAA NOT CALCULATED 05/18/2014 1949   No results found for: "CHOL", "HDL", "LDLCALC", "LDLDIRECT", "TRIG" No results found for: "HGBA1C" No results found for: "VITAMINB12" No results found for: "TSH"   ASSESSMENT AND PLAN  26 y.o. year old male  with generalized epilepsy who is presenting to establish care.  Patient first seizure was in 2003 described as generalized convulsion with urinary incontinence.  Since then he has been on Neurontin, valproic acid and currently on levetiracetam 1000 mg twice daily, seizures are well controlled.  His last convulsion was 5 years ago and his last staring spell was about 2 months ago.  He does have episode of staring spells when he misses his dose of Keppra.  At the moment I will go ahead and continue patient on the Keppra 1000 mg twice daily, check a Keppra level and if low or borderline, low threshold to increase it.  I will also obtain a routine EEG for background classification.  I will see him in 6 months for follow-up or sooner if worse.   1.  Epilepsy, generalized, convulsive (HCC)     Patient Instructions  Continue with Keppra 1000 mg twice daily We will check Keppra level today with vitamin D Routine EEG Follow-up in 6 months or sooner if worse.   Per [facility] statutes, patients with seizures are not allowed to drive until they have been seizure-free for six months.  Other recommendations include using caution when using heavy equipment or power tools. Avoid working on ladders or at heights. Take showers instead of baths.  Do not swim alone.  Ensure the water temperature is not too high on the home water heater. Do not go swimming alone. Do not lock yourself in a room alone (i.e. bathroom). When caring for infants or small children, sit down when holding, feeding, or changing them to minimize risk of injury to the child in the event you have a seizure. Maintain good sleep hygiene. Avoid alcohol.  Also recommend adequate sleep, hydration, good diet and minimize stress.   During the Seizure  - First, ensure adequate ventilation and place patients on the floor on their left side  Loosen clothing around the neck and ensure the airway is patent. If the patient is clenching the teeth, do not force the mouth open with any object as this can cause severe damage - Remove all items from the surrounding that can be hazardous. The patient may be oblivious to what's happening and may not even know what he or she is doing. If the patient is confused and wandering, either gently guide him/her away and block access to outside areas - Reassure the individual and be comforting - Call 911. In most cases, the seizure ends before EMS arrives. However, there are cases when seizures may last over 3 to 5 minutes. Or the individual may have developed breathing difficulties or severe injuries. If a pregnant patient or a person with diabetes develops a seizure, it is prudent to call an ambulance. - Finally, if the patient does not regain full  consciousness, then call EMS. Most patients will remain confused for about 45 to 90 minutes after a seizure, so you must use judgment in calling for help. - Avoid restraints but make sure the patient is in a bed  with padded side rails - Place the individual in a lateral position with the neck slightly flexed; this will help the saliva drain from the mouth and prevent the tongue from falling backward - Remove all nearby furniture and other hazards from the area - Provide verbal assurance as the individual is regaining consciousness - Provide the patient with privacy if possible - Call for help and start treatment as ordered by the caregiver   After the Seizure (Postictal Stage)  After a seizure, most patients experience confusion, fatigue, muscle pain and/or a headache. Thus, one should permit the individual to sleep. For the next few days, reassurance is essential. Being calm and helping reorient the person is also of importance.  Most seizures are painless and end spontaneously. Seizures are not harmful to others but can lead to complications such as stress on the lungs, brain and the heart. Individuals with prior lung problems may develop labored breathing and respiratory distress.     Orders Placed This Encounter  Procedures   Levetiracetam level   Vitamin D, 25-hydroxy   EEG adult    Meds ordered this encounter  Medications   levETIRAcetam (KEPPRA) 500 MG tablet    Sig: TAKE 2 TABLETS IN THE MORNING AND 2 TABLETS IN THE EVENING    Dispense:  180 tablet    Refill:  6    Return in about 6 months (around 06/28/2022).    Alric Ran, MD 12/27/2021, 9:08 AM  [facility] [address], [place], [place] [address] [phone]

## 2021-12-28 ENCOUNTER — Telehealth: Payer: Self-pay | Admitting: *Deleted

## 2021-12-28 LAB — LEVETIRACETAM LEVEL: Levetiracetam Lvl: 29.4 ug/mL (ref 10.0–40.0)

## 2021-12-28 LAB — VITAMIN D 25 HYDROXY (VIT D DEFICIENCY, FRACTURES): Vit D, 25-Hydroxy: 6.3 ng/mL — ABNORMAL LOW (ref 30.0–100.0)

## 2021-12-28 MED ORDER — VITAMIN D (ERGOCALCIFEROL) 1.25 MG (50000 UNIT) PO CAPS: 50000.0000 [IU] | ORAL_CAPSULE | ORAL | 0 refills | Status: AC

## 2021-12-28 NOTE — Progress Notes (Signed)
Please call and advise the patient that the recent Keppra level was within normal limits but the Vitamin D level was low. I have prescribed him Vitamin D supplement to take weekly for a total of 12 weeks.  Please remind patient to keep any upcoming appointments or tests and to call us with any interim questions, concerns, problems or updates. Thanks,   Windell Norfolk, MD

## 2021-12-28 NOTE — Addendum Note (Signed)
Addended byWindell Norfolk on: 12/28/2021 04:37 PM   Modules accepted: Orders

## 2021-12-28 NOTE — Telephone Encounter (Signed)
I spoke with the patient and informed him of the results. He verbalized understanding of the findings and expressed appreciation for the call. All questions answered.

## 2021-12-28 NOTE — Telephone Encounter (Signed)
-----   Message from Windell Norfolk, MD sent at 12/28/2021  4:38 PM EDT ----- Please call and advise the patient that the recent Keppra level was within normal limits but the Vitamin D level was low. I have prescribed him Vitamin D supplement to take weekly for a total of 12 weeks.  Please remind patient to keep any upcoming appointments or tests and to call us with any interim questions, concerns, problems or updates. Thanks,   Windell Norfolk, MD

## 2022-01-23 ENCOUNTER — Telehealth: Payer: Self-pay | Admitting: Neurology

## 2022-01-23 NOTE — Telephone Encounter (Signed)
FYI: We have reached out to patient 3x to get EEG scheduled, his phone goes straight to voicemail and his mailbox is full.

## 2022-03-20 ENCOUNTER — Other Ambulatory Visit: Payer: Self-pay | Admitting: Neurology

## 2022-10-09 ENCOUNTER — Other Ambulatory Visit: Payer: Self-pay | Admitting: Neurology

## 2022-10-09 DIAGNOSIS — G40309 Generalized idiopathic epilepsy and epileptic syndromes, not intractable, without status epilepticus: Secondary | ICD-10-CM

## 2022-11-21 ENCOUNTER — Encounter: Payer: Self-pay | Admitting: Neurology

## 2022-11-21 ENCOUNTER — Telehealth: Payer: Self-pay | Admitting: Neurology

## 2022-11-21 NOTE — Telephone Encounter (Signed)
Unable to LVM, VM box full. Sent letter in mail informing pt of need to reschedule 12/24/22 appt - MD out

## 2022-12-24 ENCOUNTER — Ambulatory Visit: Payer: BC Managed Care – PPO | Admitting: Neurology

## 2023-11-08 ENCOUNTER — Other Ambulatory Visit: Payer: Self-pay | Admitting: Neurology

## 2023-11-08 ENCOUNTER — Telehealth: Payer: Self-pay

## 2023-11-08 DIAGNOSIS — G40309 Generalized idiopathic epilepsy and epileptic syndromes, not intractable, without status epilepticus: Secondary | ICD-10-CM

## 2023-11-08 NOTE — Telephone Encounter (Addendum)
 Pt has returned call to April, RN. He would like a call back

## 2023-11-08 NOTE — Telephone Encounter (Signed)
 Patient returned call, scheduled appt for 6/5

## 2023-11-08 NOTE — Telephone Encounter (Signed)
 Call to patient, no answer. Left message to call back.  Call to mother, advised we would refill for 1 month but needs appt for future refills. Last visit 12/27/2021

## 2023-12-05 ENCOUNTER — Encounter: Payer: Self-pay | Admitting: Neurology

## 2023-12-05 ENCOUNTER — Ambulatory Visit (INDEPENDENT_AMBULATORY_CARE_PROVIDER_SITE_OTHER): Payer: Self-pay | Admitting: Neurology

## 2023-12-05 DIAGNOSIS — G40309 Generalized idiopathic epilepsy and epileptic syndromes, not intractable, without status epilepticus: Secondary | ICD-10-CM

## 2023-12-05 MED ORDER — LEVETIRACETAM 1000 MG PO TABS
1000.0000 mg | ORAL_TABLET | Freq: Two times a day (BID) | ORAL | 3 refills | Status: DC
Start: 2023-12-05 — End: 2024-04-08

## 2023-12-05 NOTE — Patient Instructions (Signed)
 Continue with levetiracetam  1000 mg twice daily, refill given Return in 1 year or sooner if worse Please call if any breakthrough seizure or any other questions or concerns.

## 2023-12-05 NOTE — Progress Notes (Signed)
 [facility]  PATIENT: Steven Norris DOB: Jul 14, 1995  REQUESTING CLINICIAN: No ref. 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    HISTORICAL  CHIEF COMPLAINT:  Chief Complaint  Patient presents with   Seizures    Rm13, alone, Sz: sophomore of [employer] when last sz occurred, well controlled    INTERVAL HISTORY 12/05/2023:  Patient presents today for follow-up, last visit was in June 2023.  Since then he has been doing well, tells me that he has not not had any seizure or seizure like activity.  He is compliant with his Keppra  1000 mg twice daily, no side effect from the medication. No other complaints, no other concerns.  Recently graduated his [employer] program at [employer], looking forward to start working.   HISTORY OF PRESENT ILLNESS:  This is a 28 year old gentleman past medical history of generalized epilepsy, previously managed by Dr. Darlys Eland who is presenting to establish care since retirement of Dr. Darlys Eland.  Per patient and chart review the first seizure happened on December 14, 2001 at the age of 6, described as generalized epilepsy.  He was put on initially on Neurontin and then later switched to Depakote.  He subsequently developed absence seizure therefore he was switched from Depakote to Keppra  which seemed to control both of his seizures.  He reported his last generalized convulsion was 5 years ago and his last staring spell was 2 months ago.  He does have staring spell when he misses his Keppra  dose.  Again these are very brief lasting only seconds.  No other complaint or concern. He denies any seizure risk factor, denies any side effect from the medication and denies any injury from the seizures.   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   Handedness: Right   Onset: at the age of 8   Seizure Type: Generalized shaking (last one was 5 year ago), staring spell  (last one 2 months)   Current frequency: Generalized shaking (last one was 5 year ago), staring spell (last one 2 months)  Any injuries from seizures: None   Seizure risk factors: Denies   Previous ASMs: Gabapentin, valproic acid, levetiracetam   Currenty ASMs: Keppra  1000 mg BID   ASMs side effects: None   Brain Images: None available for review  Previous EEGs: None available for review   OTHER MEDICAL CONDITIONS: Generalized epilepsy.   REVIEW OF SYSTEMS: Full 14 system review of systems performed and negative with exception of: as noted in the HPI   ALLERGIES: No Known Allergies  HOME MEDICATIONS: Outpatient Medications Prior to Visit  Medication Sig Dispense Refill   Vitamin D , Ergocalciferol , (DRISDOL ) 1.25 MG (50000 UNIT) CAPS capsule Take 1 capsule (50,000 Units total) by mouth every 7 (seven) days. 12 capsule 0   levETIRAcetam  (KEPPRA ) 500 MG tablet TAKE 2 TABLETS BY MOUTH IN THE MORNING AND IN THE EVENING 120 tablet 0   No facility-administered medications prior to visit.    PAST MEDICAL HISTORY: Past Medical History:  Diagnosis Date   Attention deficit disorder without mention of hyperactivity    Generalized convulsive epilepsy without mention of intractable epilepsy    Generalized nonconvulsive epilepsy without mention of intractable epilepsy    Seizures (HCC)     PAST SURGICAL HISTORY: Past Surgical History:  Procedure Laterality Date   REPAIR PATELLAR / INFRAPATELLARTENDON Left 2010    FAMILY HISTORY: Family History  Problem Relation  Age of Onset   Heart attack Paternal Grandfather 31       Deceased   Healthy Mother    Healthy Father     SOCIAL HISTORY: Social History   Socioeconomic History   Marital status: Single    Spouse name: Not on file   Number of children: Not on file   Years of education: Not on file   Highest education level: Not on file  Occupational History   Not on file  Tobacco Use   Smoking status: Never   Smokeless  tobacco: Never  Substance and Sexual Activity   Alcohol use: No    Alcohol/week: 0.0 standard drinks of alcohol   Drug use: No   Sexual activity: Yes    Partners: Female    Birth control/protection: Condom  Other Topics Concern   Not on file  Social History Narrative   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    He attended [employer].   He lives with his mom. He has no siblings.   He works part-time.   Social Drivers of Corporate investment banker Strain: Not on file  Food Insecurity: Not on file  Transportation Needs: Not on file  Physical Activity: Not on file  Stress: Not on file  Social Connections: Not on file  Intimate Partner Violence: Not on file    PHYSICAL EXAM  GENERAL EXAM/CONSTITUTIONAL: Vitals:  Vitals:   12/05/23 1329  BP: 115/78  Pulse: 81  Resp: 17  SpO2: 95%  Weight: 242 lb (109.8 kg)  Height: 6\' 3"  (1.905 m)   Body mass index is 30.25 kg/m. Wt Readings from Last 3 Encounters:  12/05/23 242 lb (109.8 kg)  12/27/21 218 lb (98.9 kg)  01/19/21 227 lb (103 kg)   Patient is in no distress; well developed, nourished and groomed; neck is supple MUSCULOSKELETAL: Gait, strength, tone, movements noted in Neurologic exam below  NEUROLOGIC: MENTAL STATUS:      No data to display         awake, alert, oriented to person, place and time recent and remote memory intact normal attention and concentration language fluent, comprehension intact, naming intact fund of knowledge appropriate  CRANIAL NERVE:  2nd, 3rd, 4th, 6th - pupils equal and reactive to light, visual fields full to confrontation, extraocular muscles intact, no nystagmus 5th - facial sensation symmetric 7th - facial strength symmetric 8th - hearing intact 9th - palate elevates symmetrically, uvula midline 11th - shoulder shrug symmetric 12th - tongue protrusion midline  MOTOR:  normal bulk and tone, full strength in the BUE, BLE  SENSORY:  normal and symmetric to light touch  COORDINATION:   finger-nose-finger, fine finger movements normal  GAIT/STATION:  normal   DIAGNOSTIC DATA (LABS, IMAGING, TESTING) - I reviewed patient records, labs, notes, testing and imaging myself where available.  Lab Results  Component Value Date   WBC 4.6 05/18/2014   HGB 16.0 05/18/2014   HCT 47.8 05/18/2014   MCV 88.7 05/18/2014   PLT 272 05/18/2014      Component Value Date/Time   NA 140 05/18/2014 1949   NA 141 10/01/2013 2143   K 4.0 05/18/2014 1949   K 3.9 10/01/2013 2143   CL 101 05/18/2014 1949   CL 107 10/01/2013 2143   CO2 26 05/18/2014 1949   CO2 28 (H) 10/01/2013 2143   GLUCOSE 82 05/18/2014 1949   GLUCOSE 108 (H) 10/01/2013 2143   BUN 10 05/18/2014 1949   BUN 10 10/01/2013 2143  CREATININE 0.88 05/18/2014 1949   CREATININE 0.94 10/01/2013 2143   CALCIUM 10.0 05/18/2014 1949   CALCIUM 9.0 10/01/2013 2143   PROT 7.8 05/18/2014 1949   PROT 7.7 10/01/2013 2143   ALBUMIN 4.6 05/18/2014 1949   ALBUMIN 4.2 10/01/2013 2143   AST 17 05/18/2014 1949   AST 22 10/01/2013 2143   ALT 18 05/18/2014 1949   ALT 27 10/01/2013 2143   ALKPHOS 120 05/18/2014 1949   ALKPHOS 192 (H) 10/01/2013 2143   BILITOT 0.8 05/18/2014 1949   BILITOT 0.6 10/01/2013 2143   GFRNONAA NOT CALCULATED 05/18/2014 1949   GFRAA NOT CALCULATED 05/18/2014 1949   No results found for: "CHOL", "HDL", "LDLCALC", "LDLDIRECT", "TRIG" No results found for: "HGBA1C" No results found for: "VITAMINB12" No results found for: "TSH"   ASSESSMENT AND PLAN  28 y.o. year old male  with generalized epilepsy who is presenting for follow-up, he is doing well on levetiracetam  1000 mg twice daily, denies any seizure, no side effect from the medication.  Will continue with on levetiracetam  1000 mg twice daily, refill given, I will see him for follow-up in 1 year or sooner if worse.   1. Epilepsy, generalized, convulsive (HCC)     Patient Instructions  Continue with levetiracetam  1000 mg twice daily, refill  given Return in 1 year or sooner if worse Please call if any breakthrough seizure or any other questions or concerns.   Per [place]  DMV statutes, patients with seizures are not allowed to drive until they have been seizure-free for six months.  Other recommendations include using caution when using heavy equipment or power tools. Avoid working on ladders or at heights. Take showers instead of baths.  Do not swim alone.  Ensure the water temperature is not too high on the home water heater. Do not go swimming alone. Do not lock yourself in a room alone (i.e. bathroom). When caring for infants or small children, sit down when holding, feeding, or changing them to minimize risk of injury to the child in the event you have a seizure. Maintain good sleep hygiene. Avoid alcohol.  Also recommend adequate sleep, hydration, good diet and minimize stress.   During the Seizure  - First, ensure adequate ventilation and place patients on the floor on their left side  Loosen clothing around the neck and ensure the airway is patent. If the patient is clenching the teeth, do not force the mouth open with any object as this can cause severe damage - Remove all items from the surrounding that can be hazardous. The patient may be oblivious to what's happening and may not even know what he or she is doing. If the patient is confused and wandering, either gently guide him/her away and block access to outside areas - Reassure the individual and be comforting - Call 911. In most cases, the seizure ends before EMS arrives. However, there are cases when seizures may last over 3 to 5 minutes. Or the individual may have developed breathing difficulties or severe injuries. If a pregnant patient or a person with diabetes develops a seizure, it is prudent to call an ambulance. - Finally, if the patient does not regain full consciousness, then call EMS. Most patients will remain confused for about 45 to 90 minutes after a  seizure, so you must use judgment in calling for help. - Avoid restraints but make sure the patient is in a bed with padded side rails - Place the individual in a lateral position with the  neck slightly flexed; this will help the saliva drain from the mouth and prevent the tongue from falling backward - Remove all nearby furniture and other hazards from the area - Provide verbal assurance as the individual is regaining consciousness - Provide the patient with privacy if possible - Call for help and start treatment as ordered by the caregiver   After the Seizure (Postictal Stage)  After a seizure, most patients experience confusion, fatigue, muscle pain and/or a headache. Thus, one should permit the individual to sleep. For the next few days, reassurance is essential. Being calm and helping reorient the person is also of importance.  Most seizures are painless and end spontaneously. Seizures are not harmful to others but can lead to complications such as stress on the lungs, brain and the heart. Individuals with prior lung problems may develop labored breathing and respiratory distress.     No orders of the defined types were placed in this encounter.   Meds ordered this encounter  Medications   levETIRAcetam  (KEPPRA ) 1000 MG tablet    Sig: Take 1 tablet (1,000 mg total) by mouth 2 (two) times daily. TAKE 2 TABLETS BY MOUTH IN THE MORNING AND IN THE EVENING    Dispense:  180 tablet    Refill:  3    Return in about 1 year (around 12/04/2024).    Cassandra Cleveland, MD 12/05/2023, 2:04 PM  [facility] [address], [address] [place], [place] [address] [phone]

## 2023-12-10 ENCOUNTER — Other Ambulatory Visit: Payer: Self-pay | Admitting: Neurology

## 2023-12-10 DIAGNOSIS — G40309 Generalized idiopathic epilepsy and epileptic syndromes, not intractable, without status epilepticus: Secondary | ICD-10-CM

## 2024-04-08 ENCOUNTER — Telehealth: Payer: Self-pay | Admitting: Neurology

## 2024-04-08 DIAGNOSIS — G40309 Generalized idiopathic epilepsy and epileptic syndromes, not intractable, without status epilepticus: Secondary | ICD-10-CM

## 2024-04-08 MED ORDER — LEVETIRACETAM 1000 MG PO TABS
1000.0000 mg | ORAL_TABLET | Freq: Two times a day (BID) | ORAL | 3 refills | Status: AC
Start: 2024-04-08 — End: 2025-04-03

## 2024-04-08 NOTE — Telephone Encounter (Signed)
 Pt reports he is down to his last bottle of levETIRAcetam  (KEPPRA ) 1000 MG tablet  with about 15 pills, please refill to  [facility] [phone]

## 2024-04-08 NOTE — Telephone Encounter (Signed)
 refilled
